# Patient Record
Sex: Male | Born: 1980 | Hispanic: No | Marital: Married | Smoking: Never smoker
Health system: Southern US, Community
[De-identification: ages and names within clinical notes are randomized; demographics above are authoritative.]

---

## 2019-04-11 DIAGNOSIS — E785 Hyperlipidemia, unspecified: Secondary | ICD-10-CM | POA: Insufficient documentation

## 2020-07-17 DIAGNOSIS — R7303 Prediabetes: Secondary | ICD-10-CM | POA: Insufficient documentation

## 2020-08-05 ENCOUNTER — Encounter (HOSPITAL_COMMUNITY)
Admission: EM | Disposition: A | Discharge status: Home / Self Care | Payer: Self-pay | Source: Home / Self Care | Attending: Emergency Medicine

## 2020-08-05 ENCOUNTER — Ambulatory Visit (HOSPITAL_COMMUNITY)
Admission: EM | Admit: 2020-08-05 | Discharge: 2020-08-05 | Disposition: A | Discharge status: Home / Self Care | Payer: 59 | Attending: General Surgery | Admitting: General Surgery

## 2020-08-05 ENCOUNTER — Other Ambulatory Visit (HOSPITAL_COMMUNITY): Payer: Self-pay

## 2020-08-05 ENCOUNTER — Emergency Department (HOSPITAL_COMMUNITY): Payer: 59

## 2020-08-05 ENCOUNTER — Emergency Department (HOSPITAL_COMMUNITY): Payer: 59 | Admitting: Certified Registered Nurse Anesthetist

## 2020-08-05 ENCOUNTER — Other Ambulatory Visit: Payer: Self-pay

## 2020-08-05 ENCOUNTER — Encounter (HOSPITAL_COMMUNITY): Payer: Self-pay | Admitting: Emergency Medicine

## 2020-08-05 DIAGNOSIS — K760 Fatty (change of) liver, not elsewhere classified: Secondary | ICD-10-CM | POA: Diagnosis not present

## 2020-08-05 DIAGNOSIS — K819 Cholecystitis, unspecified: Secondary | ICD-10-CM

## 2020-08-05 DIAGNOSIS — K8010 Calculus of gallbladder with chronic cholecystitis without obstruction: Secondary | ICD-10-CM | POA: Diagnosis not present

## 2020-08-05 DIAGNOSIS — R7303 Prediabetes: Secondary | ICD-10-CM | POA: Diagnosis not present

## 2020-08-05 DIAGNOSIS — E785 Hyperlipidemia, unspecified: Secondary | ICD-10-CM | POA: Diagnosis not present

## 2020-08-05 DIAGNOSIS — Z8616 Personal history of COVID-19: Secondary | ICD-10-CM | POA: Insufficient documentation

## 2020-08-05 HISTORY — PX: CHOLECYSTECTOMY: SHX55

## 2020-08-05 LAB — COMPREHENSIVE METABOLIC PANEL
ALT: 11 U/L (ref 0–44)
AST: 15 U/L (ref 15–41)
Albumin: 4.3 g/dL (ref 3.5–5.0)
Alkaline Phosphatase: 52 U/L (ref 38–126)
Anion gap: 9 (ref 5–15)
BUN: 19 mg/dL (ref 6–20)
CO2: 27 mmol/L (ref 22–32)
Calcium: 9.4 mg/dL (ref 8.9–10.3)
Chloride: 103 mmol/L (ref 98–111)
Creatinine, Ser: 0.95 mg/dL (ref 0.61–1.24)
GFR, Estimated: >60 mL/min (ref >60)
Glucose, Bld: 109 mg/dL — ABNORMAL HIGH (ref 70–99)
Potassium: 4.1 mmol/L (ref 3.5–5.1)
Sodium: 139 mmol/L (ref 135–145)
Total Bilirubin: 0.6 mg/dL (ref 0.3–1.2)
Total Protein: 7.4 g/dL (ref 6.5–8.1)

## 2020-08-05 LAB — RESP PANEL BY RT-PCR (FLU A&B, COVID) ARPGX2
Influenza A by PCR: NEGATIVE
Influenza B by PCR: NEGATIVE
SARS Coronavirus 2 by RT PCR: NEGATIVE

## 2020-08-05 LAB — CBC WITH DIFFERENTIAL/PLATELET
Abs Immature Granulocytes: 0.02 10*3/uL (ref 0.00–0.07)
Basophils Absolute: 0 10*3/uL (ref 0.0–0.1)
Basophils Relative: 0 %
Eosinophils Absolute: 0.3 10*3/uL (ref 0.0–0.5)
Eosinophils Relative: 3 %
HCT: 49.8 % (ref 39.0–52.0)
Hemoglobin: 16.4 g/dL (ref 13.0–17.0)
Immature Granulocytes: 0 %
Lymphocytes Relative: 35 %
Lymphs Abs: 3 10*3/uL (ref 0.7–4.0)
MCH: 30.4 pg (ref 26.0–34.0)
MCHC: 32.9 g/dL (ref 30.0–36.0)
MCV: 92.2 fL (ref 80.0–100.0)
Monocytes Absolute: 0.6 10*3/uL (ref 0.1–1.0)
Monocytes Relative: 7 %
Neutro Abs: 4.7 10*3/uL (ref 1.7–7.7)
Neutrophils Relative %: 55 %
Platelets: 346 10*3/uL (ref 150–400)
RBC: 5.4 MIL/uL (ref 4.22–5.81)
RDW: 12.3 % (ref 11.5–15.5)
WBC: 8.6 10*3/uL (ref 4.0–10.5)
nRBC: 0 % (ref 0.0–0.2)

## 2020-08-05 LAB — TROPONIN I (HIGH SENSITIVITY)
Troponin I (High Sensitivity): 5 ng/L (ref <18)
Troponin I (High Sensitivity): 5 ng/L (ref <18)

## 2020-08-05 LAB — LIPASE, BLOOD: Lipase: 64 U/L — ABNORMAL HIGH (ref 11–51)

## 2020-08-05 SURGERY — LAPAROSCOPIC CHOLECYSTECTOMY
Anesthesia: General | Site: Abdomen

## 2020-08-05 MED ORDER — 0.9 % SODIUM CHLORIDE (POUR BTL) OPTIME
TOPICAL | Status: DC | PRN
  Administered 2020-08-05: 1000 mL

## 2020-08-05 MED ORDER — FENTANYL CITRATE (PF) 100 MCG/2ML IJ SOLN
INTRAMUSCULAR | Status: AC
  Filled 2020-08-05: qty 2

## 2020-08-05 MED ORDER — KETOROLAC TROMETHAMINE 30 MG/ML IJ SOLN
30.0000 mg | Freq: Once | INTRAMUSCULAR | Status: AC
  Administered 2020-08-05: 30 mg via INTRAVENOUS

## 2020-08-05 MED ORDER — ONDANSETRON HCL 4 MG/2ML IJ SOLN
INTRAMUSCULAR | Status: AC
  Filled 2020-08-05: qty 2

## 2020-08-05 MED ORDER — PROPOFOL 10 MG/ML IV BOLUS
INTRAVENOUS | Status: AC
  Filled 2020-08-05: qty 20

## 2020-08-05 MED ORDER — CHLORHEXIDINE GLUCONATE 0.12 % MT SOLN: 15.0000 mL | Freq: Once | OROMUCOSAL | Status: AC

## 2020-08-05 MED ORDER — KETOROLAC TROMETHAMINE 30 MG/ML IJ SOLN
INTRAMUSCULAR | Status: AC
  Filled 2020-08-05: qty 1

## 2020-08-05 MED ORDER — SODIUM CHLORIDE 0.9 % IR SOLN
Status: DC | PRN
  Administered 2020-08-05: 1000 mL

## 2020-08-05 MED ORDER — OXYCODONE HCL 5 MG PO TABS
5.0000 mg | ORAL_TABLET | Freq: Four times a day (QID) | ORAL | 0 refills | Status: AC | PRN
  Filled 2020-08-05: qty 15, 4d supply, fill #0

## 2020-08-05 MED ORDER — ORAL CARE MOUTH RINSE: 15.0000 mL | Freq: Once | OROMUCOSAL | Status: AC

## 2020-08-05 MED ORDER — LACTATED RINGERS IV SOLN: INTRAVENOUS | Status: DC

## 2020-08-05 MED ORDER — IOHEXOL 300 MG/ML  SOLN
75.0000 mL | Freq: Once | INTRAMUSCULAR | Status: AC | PRN
  Administered 2020-08-05: 75 mL via INTRAVENOUS

## 2020-08-05 MED ORDER — SUGAMMADEX SODIUM 200 MG/2ML IV SOLN
INTRAVENOUS | Status: DC | PRN
  Administered 2020-08-05: 200 mg via INTRAVENOUS
  Administered 2020-08-05: 100 mg via INTRAVENOUS

## 2020-08-05 MED ORDER — LIDOCAINE VISCOUS HCL 2 % MT SOLN
15.0000 mL | Freq: Once | OROMUCOSAL | Status: AC
  Administered 2020-08-05: 15 mL via ORAL
  Filled 2020-08-05: qty 15

## 2020-08-05 MED ORDER — LIDOCAINE HCL (PF) 2 % IJ SOLN
INTRAMUSCULAR | Status: AC
  Filled 2020-08-05: qty 5

## 2020-08-05 MED ORDER — CHLORHEXIDINE GLUCONATE 0.12 % MT SOLN
OROMUCOSAL | Status: AC
  Administered 2020-08-05: 15 mL via OROMUCOSAL
  Filled 2020-08-05: qty 15

## 2020-08-05 MED ORDER — FENTANYL CITRATE (PF) 250 MCG/5ML IJ SOLN
INTRAMUSCULAR | Status: AC
  Filled 2020-08-05: qty 5

## 2020-08-05 MED ORDER — POLYETHYLENE GLYCOL 3350 17 G PO PACK: 17.0000 g | PACK | Freq: Every day | ORAL | 0 refills | Status: AC | PRN

## 2020-08-05 MED ORDER — OXYCODONE HCL 5 MG PO TABS: 5.0000 mg | ORAL_TABLET | Freq: Once | ORAL | Status: DC | PRN

## 2020-08-05 MED ORDER — ACETAMINOPHEN 500 MG PO TABS: 1000.0000 mg | ORAL_TABLET | Freq: Four times a day (QID) | ORAL | 2 refills | Status: AC | PRN

## 2020-08-05 MED ORDER — DEXAMETHASONE SODIUM PHOSPHATE 10 MG/ML IJ SOLN
INTRAMUSCULAR | Status: AC
  Filled 2020-08-05: qty 1

## 2020-08-05 MED ORDER — PHENYLEPHRINE 40 MCG/ML (10ML) SYRINGE FOR IV PUSH (FOR BLOOD PRESSURE SUPPORT)
PREFILLED_SYRINGE | INTRAVENOUS | Status: DC | PRN
  Administered 2020-08-05: 4 ug via INTRAVENOUS

## 2020-08-05 MED ORDER — OXYCODONE HCL 5 MG/5ML PO SOLN: 5.0000 mg | Freq: Once | ORAL | Status: DC | PRN

## 2020-08-05 MED ORDER — ACETAMINOPHEN 10 MG/ML IV SOLN: 1000.0000 mg | Freq: Once | INTRAVENOUS | Status: DC | PRN

## 2020-08-05 MED ORDER — DEXMEDETOMIDINE (PRECEDEX) IN NS 20 MCG/5ML (4 MCG/ML) IV SYRINGE
PREFILLED_SYRINGE | INTRAVENOUS | Status: AC
  Filled 2020-08-05: qty 5

## 2020-08-05 MED ORDER — MIDAZOLAM HCL 5 MG/5ML IJ SOLN
INTRAMUSCULAR | Status: DC | PRN
  Administered 2020-08-05: 2 mg via INTRAVENOUS

## 2020-08-05 MED ORDER — PROMETHAZINE HCL 25 MG/ML IJ SOLN: 6.2500 mg | INTRAMUSCULAR | Status: DC | PRN

## 2020-08-05 MED ORDER — ROCURONIUM BROMIDE 10 MG/ML (PF) SYRINGE
PREFILLED_SYRINGE | INTRAVENOUS | Status: DC | PRN
  Administered 2020-08-05: 50 mg via INTRAVENOUS

## 2020-08-05 MED ORDER — ONDANSETRON HCL 4 MG/2ML IJ SOLN
INTRAMUSCULAR | Status: DC | PRN
  Administered 2020-08-05: 4 mg via INTRAVENOUS

## 2020-08-05 MED ORDER — MIDAZOLAM HCL 2 MG/2ML IJ SOLN
INTRAMUSCULAR | Status: AC
  Filled 2020-08-05: qty 2

## 2020-08-05 MED ORDER — AMISULPRIDE (ANTIEMETIC) 5 MG/2ML IV SOLN: 10.0000 mg | Freq: Once | INTRAVENOUS | Status: DC | PRN

## 2020-08-05 MED ORDER — LIDOCAINE 2% (20 MG/ML) 5 ML SYRINGE
INTRAMUSCULAR | Status: DC | PRN
  Administered 2020-08-05: 60 mg via INTRAVENOUS

## 2020-08-05 MED ORDER — DEXMEDETOMIDINE (PRECEDEX) IN NS 20 MCG/5ML (4 MCG/ML) IV SYRINGE
PREFILLED_SYRINGE | INTRAVENOUS | Status: DC | PRN
  Administered 2020-08-05: 8 ug via INTRAVENOUS

## 2020-08-05 MED ORDER — ALUM & MAG HYDROXIDE-SIMETH 200-200-20 MG/5ML PO SUSP
30.0000 mL | Freq: Once | ORAL | Status: AC
  Administered 2020-08-05: 30 mL via ORAL
  Filled 2020-08-05: qty 30

## 2020-08-05 MED ORDER — FENTANYL CITRATE (PF) 100 MCG/2ML IJ SOLN
25.0000 ug | INTRAMUSCULAR | Status: DC | PRN
  Administered 2020-08-05: 25 ug via INTRAVENOUS

## 2020-08-05 MED ORDER — FENTANYL CITRATE (PF) 250 MCG/5ML IJ SOLN
INTRAMUSCULAR | Status: DC | PRN
  Administered 2020-08-05 (×2): 50 ug via INTRAVENOUS
  Administered 2020-08-05: 100 ug via INTRAVENOUS
  Administered 2020-08-05: 50 ug via INTRAVENOUS

## 2020-08-05 MED ORDER — BUPIVACAINE HCL 0.25 % IJ SOLN
INTRAMUSCULAR | Status: DC | PRN
  Administered 2020-08-05: 7 mL

## 2020-08-05 MED ORDER — PROPOFOL 10 MG/ML IV BOLUS
INTRAVENOUS | Status: DC | PRN
  Administered 2020-08-05: 150 mg via INTRAVENOUS
  Administered 2020-08-05: 30 mg via INTRAVENOUS

## 2020-08-05 MED ORDER — SODIUM CHLORIDE 0.9 % IV SOLN
2.0000 g | Freq: Once | INTRAVENOUS | Status: DC
  Filled 2020-08-05: qty 20

## 2020-08-05 SURGICAL SUPPLY — 37 items
CANISTER SUCT 3000ML PPV (MISCELLANEOUS) ×2 IMPLANT
CHLORAPREP W/TINT 26 (MISCELLANEOUS) ×2 IMPLANT
CLIP VESOLOCK MED LG 6/CT (CLIP) ×2 IMPLANT
COVER SURGICAL LIGHT HANDLE (MISCELLANEOUS) ×2 IMPLANT
COVER TRANSDUCER ULTRASND (DRAPES) ×2 IMPLANT
COVER WAND RF STERILE (DRAPES) ×2 IMPLANT
DERMABOND ADVANCED (GAUZE/BANDAGES/DRESSINGS) ×1
DERMABOND ADVANCED .7 DNX12 (GAUZE/BANDAGES/DRESSINGS) ×1 IMPLANT
ELECT REM PT RETURN 9FT ADLT (ELECTROSURGICAL) ×2
ELECTRODE REM PT RTRN 9FT ADLT (ELECTROSURGICAL) ×1 IMPLANT
GLOVE BIO SURGEON STRL SZ7.5 (GLOVE) ×2 IMPLANT
GOWN STRL REUS W/ TWL LRG LVL3 (GOWN DISPOSABLE) ×2 IMPLANT
GOWN STRL REUS W/ TWL XL LVL3 (GOWN DISPOSABLE) ×1 IMPLANT
GOWN STRL REUS W/TWL LRG LVL3 (GOWN DISPOSABLE) ×2
GOWN STRL REUS W/TWL XL LVL3 (GOWN DISPOSABLE) ×1
GRASPER SUT TROCAR 14GX15 (MISCELLANEOUS) ×2 IMPLANT
KIT BASIN OR (CUSTOM PROCEDURE TRAY) ×2 IMPLANT
KIT TURNOVER KIT B (KITS) ×2 IMPLANT
NEEDLE INSUFFLATION 14GA 120MM (NEEDLE) ×2 IMPLANT
NS IRRIG 1000ML POUR BTL (IV SOLUTION) ×2 IMPLANT
PAD ARMBOARD 7.5X6 YLW CONV (MISCELLANEOUS) ×2 IMPLANT
POUCH LAPAROSCOPIC INSTRUMENT (MISCELLANEOUS) ×2 IMPLANT
POUCH RETRIEVAL ECOSAC 10 (ENDOMECHANICALS) IMPLANT
POUCH RETRIEVAL ECOSAC 10MM (ENDOMECHANICALS)
SCISSORS LAP 5X35 DISP (ENDOMECHANICALS) ×2 IMPLANT
SET IRRIG TUBING LAPAROSCOPIC (IRRIGATION / IRRIGATOR) ×2 IMPLANT
SET TUBE SMOKE EVAC HIGH FLOW (TUBING) ×2 IMPLANT
SLEEVE ENDOPATH XCEL 5M (ENDOMECHANICALS) ×2 IMPLANT
SPECIMEN JAR SMALL (MISCELLANEOUS) ×2 IMPLANT
SUT MNCRL AB 4-0 PS2 18 (SUTURE) ×2 IMPLANT
TOWEL GREEN STERILE (TOWEL DISPOSABLE) ×2 IMPLANT
TOWEL GREEN STERILE FF (TOWEL DISPOSABLE) ×2 IMPLANT
TRAY LAPAROSCOPIC MC (CUSTOM PROCEDURE TRAY) ×2 IMPLANT
TROCAR XCEL NON-BLD 11X100MML (ENDOMECHANICALS) ×2 IMPLANT
TROCAR XCEL NON-BLD 5MMX100MML (ENDOMECHANICALS) ×2 IMPLANT
WARMER LAPAROSCOPE (MISCELLANEOUS) ×2 IMPLANT
WATER STERILE IRR 1000ML POUR (IV SOLUTION) ×2 IMPLANT

## 2020-08-05 NOTE — Anesthesia Preprocedure Evaluation (Addendum)
Anesthesia Evaluation  Patient identified by MRN, date of birth, ID band Patient awake    Reviewed: Allergy & Precautions, NPO status , Patient's Chart, lab work & pertinent test results  Airway Mallampati: II  TM Distance: >3 FB Neck ROM: Full    Dental no notable dental hx.    Pulmonary neg pulmonary ROS,    Pulmonary exam normal breath sounds clear to auscultation       Cardiovascular negative cardio ROS Normal cardiovascular exam Rhythm:Regular Rate:Normal  ECG: NSR, rate 74   Neuro/Psych negative neurological ROS  negative psych ROS   GI/Hepatic negative GI ROS, Neg liver ROS,   Endo/Other  negative endocrine ROS  Renal/GU negative Renal ROS     Musculoskeletal negative musculoskeletal ROS (+)   Abdominal   Peds  Hematology negative hematology ROS (+)   Anesthesia Other Findings ACUTE CHOLECYSTITIS  Reproductive/Obstetrics                            Anesthesia Physical Anesthesia Plan  ASA: I and emergent  Anesthesia Plan: General   Post-op Pain Management:    Induction: Intravenous  PONV Risk Score and Plan: 2 and Ondansetron, Dexamethasone, Midazolam and Treatment may vary due to age or medical condition  Airway Management Planned: Oral ETT  Additional Equipment:   Intra-op Plan:   Post-operative Plan: Extubation in OR  Informed Consent: I have reviewed the patients History and Physical, chart, labs and discussed the procedure including the risks, benefits and alternatives for the proposed anesthesia with the patient or authorized representative who has indicated his/her understanding and acceptance.     Dental advisory given and Interpreter used for interveiw  Plan Discussed with: CRNA  Anesthesia Plan Comments:        Anesthesia Quick Evaluation

## 2020-08-05 NOTE — ED Provider Notes (Signed)
[facility] EMERGENCY DEPARTMENT Provider Note   CSN: [id number] Arrival date & time: 08/05/20  0204     History Chief Complaint  Patient presents with  . Abdominal Pain    William Delacruz is a 40 y.o. male.  Patient presents to the emergency department for evaluation of abdominal pain.  Symptoms ongoing for months.  Patient reports upper abdominal pain that occurs after eating.  He reports if he eats too much at 1 time or multiple meals during the day the pain occurs.  He has forced himself to vomit and this has alleviated some of the symptoms.  He has had intermittent diarrhea with this.        History reviewed. No pertinent past medical history.  There are no problems to display for this patient.   History reviewed. No pertinent surgical history.     No family history on file.     Home Medications Prior to Admission medications   Not on File    Allergies    Patient has no known allergies.  Review of Systems   Review of Systems  Gastrointestinal: Positive for abdominal pain and diarrhea.  All other systems reviewed and are negative.   Physical Exam Updated Vital Signs BP 132/90   Pulse 75   Temp 98.2 F (36.8 C) (Oral)   Resp 17   Ht 5\' 7"  (1.702 m)   Wt 68 kg   SpO2 99%   BMI 23.49 kg/m   Physical Exam Vitals and nursing note reviewed.  Constitutional:      General: He is not in acute distress.    Appearance: Normal appearance. He is well-developed.  HENT:     Head: Normocephalic and atraumatic.     Right Ear: Hearing normal.     Left Ear: Hearing normal.     Nose: Nose normal.  Eyes:     Conjunctiva/sclera: Conjunctivae normal.     Pupils: Pupils are equal, round, and reactive to light.  Cardiovascular:     Rate and Rhythm: Regular rhythm.     Heart sounds: S1 normal and S2 normal. No murmur heard. No friction rub. No gallop.   Pulmonary:     Effort: Pulmonary effort is normal. No respiratory distress.     Breath sounds:  Normal breath sounds.  Chest:     Chest wall: No tenderness.  Abdominal:     General: Bowel sounds are normal.     Palpations: Abdomen is soft.     Tenderness: There is abdominal tenderness in the epigastric area. There is no guarding or rebound. Negative signs include Murphy's sign and McBurney's sign.     Hernia: No hernia is present.  Musculoskeletal:        General: Normal range of motion.     Cervical back: Normal range of motion and neck supple.  Skin:    General: Skin is warm and dry.     Findings: No rash.  Neurological:     Mental Status: He is alert and oriented to person, place, and time.     GCS: GCS eye subscore is 4. GCS verbal subscore is 5. GCS motor subscore is 6.     Cranial Nerves: No cranial nerve deficit.     Sensory: No sensory deficit.     Coordination: Coordination normal.  Psychiatric:        Speech: Speech normal.        Behavior: Behavior normal.        Thought Content: Thought  content normal.     ED Results / Procedures / Treatments   Labs (all labs ordered are listed, but only abnormal results are displayed) Labs Reviewed  COMPREHENSIVE METABOLIC PANEL - Abnormal; Notable for the following components:      Result Value   Glucose, Bld 109 (*)    All other components within normal limits  LIPASE, BLOOD - Abnormal; Notable for the following components:   Lipase 64 (*)    All other components within normal limits  RESP PANEL BY RT-PCR (FLU A&B, COVID) ARPGX2  CBC WITH DIFFERENTIAL/PLATELET  TROPONIN I (HIGH SENSITIVITY)  TROPONIN I (HIGH SENSITIVITY)    EKG None  Radiology CT ABDOMEN PELVIS W CONTRAST  Result Date: 08/05/2020 CLINICAL DATA:  Nonlocalized acute abdominal pain. EXAM: CT ABDOMEN AND PELVIS WITH CONTRAST TECHNIQUE: Multidetector CT imaging of the abdomen and pelvis was performed using the standard protocol following bolus administration of intravenous contrast. CONTRAST:  42mL OMNIPAQUE IOHEXOL 300 MG/ML  SOLN COMPARISON:   Ultrasound abdomen 08/05/2020 FINDINGS: Lower chest: No acute abnormality. Hepatobiliary: Diffusely decreased hepatic parenchyma. No focal liver abnormality. 0.5 cm calcified gallstone at the gallbladder neck with associated gallbladder wall thickening and trace pericholecystic fluid. No biliary dilatation. Pancreas: No focal lesion. Normal pancreatic contour. No surrounding inflammatory changes. No main pancreatic ductal dilatation. No pseudocyst formation. Spleen: Normal in size without focal abnormality. Adrenals/Urinary Tract: No adrenal nodule bilaterally. Bilateral kidneys enhance symmetrically. No hydronephrosis. No hydroureter. The urinary bladder is unremarkable. Stomach/Bowel: Stomach is within normal limits. No evidence of bowel wall thickening or dilatation. Appendix appears normal. Vascular/Lymphatic: No significant vascular findings are present. No enlarged abdominal or pelvic lymph nodes. Reproductive: Prostate is unremarkable. Other: No intraperitoneal free fluid. No intraperitoneal free gas. No organized fluid collection. Musculoskeletal: No acute or significant osseous findings. IMPRESSION: 1. Cholelithiasis with findings suggestive of acute cholecystitis. Possibly impacted 0.5cm gallstone within the gallbladder neck. 2. Mild hepatic steatosis. 3. No CT findings of acute pancreatitis. Electronically Signed   By: Tish Frederickson M.D.   On: 08/05/2020 06:37   US Abdomen Limited  Result Date: 08/05/2020 CLINICAL DATA:  Right upper quadrant abdominal pain, chest pain EXAM: ULTRASOUND ABDOMEN LIMITED RIGHT UPPER QUADRANT COMPARISON:  None. FINDINGS: Gallbladder: There is layering sludge within the gallbladder lumen. No shadowing gallstones identified. The gallbladder, however, is not distended. No gallbladder wall thickening or pericholecystic fluid is identified. The sonographic Eulah Pont sign is reportedly negative. Common bile duct: Diameter: 6 mm in proximal diameter Liver: Hepatic parenchymal  echogenicity is mildly diffusely increased in keeping with changes of mild hepatic steatosis. Focal fatty sparing noted within the gallbladder fossa. No focal intrahepatic masses or intrahepatic biliary ductal dilation is identified. Portal vein is patent on color Doppler imaging with normal direction of blood flow towards the liver. Other: No ascites IMPRESSION: Cholelithiasis without sonographic evidence of acute cholecystitis. Mild hepatic steatosis. Electronically Signed   By: Helyn Numbers MD   On: 08/05/2020 04:27   DG Abdomen Acute W/Chest  Result Date: 08/05/2020 CLINICAL DATA:  Chest pain. Abdominal pain. Right upper quadrant pain. Pt c/o of intermittent upper abdomen pain for 6 months. Pt experiences the pain after eating, the pain is much worse after a full meal. EXAM: DG ABDOMEN ACUTE WITH 1 VIEW CHEST COMPARISON:  None. FINDINGS: There is no evidence of dilated bowel loops or free intraperitoneal air. No radiopaque calculi or other significant radiographic abnormality is seen. Heart size and mediastinal contours are within normal limits. Both lungs are clear. IMPRESSION:  Negative abdominal radiographs.  No acute cardiopulmonary disease. Electronically Signed   By: Tish Frederickson M.D.   On: 08/05/2020 04:01    Procedures Procedures   Medications Ordered in ED Medications  cefTRIAXone (ROCEPHIN) 2 g in sodium chloride 0.9 % 100 mL IVPB (has no administration in time range)  alum & mag hydroxide-simeth (MAALOX/MYLANTA) 200-200-20 MG/5ML suspension 30 mL (30 mLs Oral Given 08/05/20 0311)    And  lidocaine (XYLOCAINE) 2 % viscous mouth solution 15 mL (15 mLs Oral Given 08/05/20 0312)  iohexol (OMNIPAQUE) 300 MG/ML solution 75 mL (75 mLs Intravenous Contrast Given 08/05/20 0612)    ED Course  I have reviewed the triage vital signs and the nursing notes.  Pertinent labs & imaging results that were available during my care of the patient were reviewed by me and considered in my medical decision  making (see chart for details).    MDM Rules/Calculators/A&P                          Patient with intermittent abdominal pain for several months.  Symptoms are brought on by eating meals.  Records reviewed from PCP at [facility].  Negative H. pylori breath test.  Mildly elevated lipase, lab work otherwise normal.  Was started on omeprazole.    Once again today his lipase is slightly elevated.  Remainder of labs are unremarkable.  Ultrasound with possible sludge in the gallbladder but no clear signs of cholecystitis.  Patient therefore underwent CT scan to further evaluate.  CT scan is more convincing for acute cholecystitis.  General surgery consult.   Final Clinical Impression(s) / ED Diagnoses Final diagnoses:  Cholecystitis    Rx / DC Orders ED Discharge Orders    None       Morningstar Toft, Canary Brim, MD 08/05/20 [phone]

## 2020-08-05 NOTE — Anesthesia Postprocedure Evaluation (Signed)
Anesthesia Post Note  Patient: William Delacruz  Procedure(s) Performed: LAPAROSCOPIC CHOLECYSTECTOMY (N/A Abdomen)     Patient location during evaluation: PACU Anesthesia Type: General Level of consciousness: awake Pain management: pain level controlled Vital Signs Assessment: post-procedure vital signs reviewed and stable Respiratory status: spontaneous breathing, nonlabored ventilation, respiratory function stable and patient connected to nasal cannula oxygen Cardiovascular status: blood pressure returned to baseline and stable Postop Assessment: no apparent nausea or vomiting Anesthetic complications: no   No complications documented.  Last Vitals:  Vitals:   08/05/20 1455 08/05/20 1510  BP: (!) 124/91 129/84  Pulse: 72 72  Resp: 14 14  Temp:  (!) 36.2 C  SpO2: 95% 95%    Last Pain:  Vitals:   08/05/20 1510  TempSrc:   PainSc: 4                  Chaka Jefferys P Finneas Mathe

## 2020-08-05 NOTE — ED Notes (Signed)
Provider at bedside

## 2020-08-05 NOTE — H&P (Signed)
History and Physical  William Delacruz Jun 09, 1980  [id number].    Requesting MD: Dr. Jaci Carrel Chief Complaint/Reason for Consult: early cholecystitis  HPI:  40 year old [place] speaking male with past medical history of hyperlipidemia and prediabetes who presented to [facility] ED due to to upper abdominal pain.  Pain has been going on for the past 6 months but has gotten worse and more frequent over the last month and he decided to come to the ED last night to find out what was going on.  Pain occurs after eating and he has related nausea and emesis.  Pain resolves with emesis.  He denies fever or chills at home.  ROS otherwise as below  Work-up in the ED significant for RUQ US showing cholelithiasis without sonographic evidence of acute cholecystitis and CT scan showing findings suggestive of acute cholecystitis with possibly impacted 0.5 cm gallstone within the gallbladder neck.  No findings of acute pancreatitis. WBC normal, LFTs normal.  Lipase 64  We were asked to see due to findings of possible early cholecystitis.  Allergies: No known drug allergies Blood thinners: None Past Surgeries: No past abdominal surgeries Last oral intake yesterday at 2000  ROS: Review of Systems  Constitutional: Negative for chills and fever.  Respiratory: Negative for cough, shortness of breath and wheezing.   Cardiovascular: Negative for chest pain, palpitations and leg swelling.  Gastrointestinal: Positive for abdominal pain, nausea and vomiting. Negative for constipation and diarrhea.    No family history on file.  History reviewed. No pertinent past medical history.  History reviewed. No pertinent surgical history.  Social History:  has no history on file for tobacco use, alcohol use, and drug use.  Allergies: No Known Allergies  (Not in a hospital admission)   Blood pressure (!) 143/100, pulse 69, temperature 98.2 F (36.8 C), temperature source Oral, resp. rate 18, height  5\' 7"  (1.702 m), weight 68 kg, SpO2 100 %. Physical Exam:  General: pleasant, WD, male who is laying in bed in NAD HEENT: head is normocephalic, atraumatic.  Pupils equal and round.  Conjunctiva normal.  Ears and nose without any masses or lesions.  Mouth is pink and moist Heart: regular, rate, and rhythm.  Normal s1,s2. No obvious murmurs, gallops, or rubs noted.  Palpable radial pulses bilaterally.  No cyanosis. Lungs: CTAB, no wheezes, rhonchi, or rales noted.  Respiratory effort nonlabored Abd: soft, ND, +BS, no masses, hernias, or organomegaly.  Tender in epigastrium.  Negative Murphy sign.  No rebound or guarding MS: all 4 extremities are symmetrical with no cyanosis, clubbing, or edema. Skin: warm and dry with no masses, lesions, or rashes Neuro: Cranial nerves 2-12 grossly intact, sensation is normal throughout Psych: A&Ox3 with an appropriate affect.   Results for orders placed or performed during the hospital encounter of 08/05/20 (from the past 48 hour(s))  CBC with Differential/Platelet     Status: None   Collection Time: 08/05/20  2:45 AM  Result Value Ref Range   WBC 8.6 4.0 - 10.5 K/uL   RBC 5.40 4.22 - 5.81 MIL/uL   Hemoglobin 16.4 13.0 - 17.0 g/dL   HCT 10/05/20 11.5 - 72.6 %   MCV 92.2 80.0 - 100.0 fL   MCH 30.4 26.0 - 34.0 pg   MCHC 32.9 30.0 - 36.0 g/dL   RDW 20.3 55.9 - 74.1 %   Platelets 346 150 - 400 K/uL   nRBC 0.0 0.0 - 0.2 %   Neutrophils Relative %  55 %   Neutro Abs 4.7 1.7 - 7.7 K/uL   Lymphocytes Relative 35 %   Lymphs Abs 3.0 0.7 - 4.0 K/uL   Monocytes Relative 7 %   Monocytes Absolute 0.6 0.1 - 1.0 K/uL   Eosinophils Relative 3 %   Eosinophils Absolute 0.3 0.0 - 0.5 K/uL   Basophils Relative 0 %   Basophils Absolute 0.0 0.0 - 0.1 K/uL   Immature Granulocytes 0 %   Abs Immature Granulocytes 0.02 0.00 - 0.07 K/uL    Comment: Performed at [facility], [address], [place], [place] [address]  Comprehensive metabolic panel     Status: Abnormal    Collection Time: 08/05/20  2:45 AM  Result Value Ref Range   Sodium 139 135 - 145 mmol/L   Potassium 4.1 3.5 - 5.1 mmol/L   Chloride 103 98 - 111 mmol/L   CO2 27 22 - 32 mmol/L   Glucose, Bld 109 (H) 70 - 99 mg/dL    Comment: Glucose reference range applies only to samples taken after fasting for at least 8 hours.   BUN 19 6 - 20 mg/dL   Creatinine, Ser 5.91 0.61 - 1.24 mg/dL   Calcium 9.4 8.9 - 63.8 mg/dL   Total Protein 7.4 6.5 - 8.1 g/dL   Albumin 4.3 3.5 - 5.0 g/dL   AST 15 15 - 41 U/L   ALT 11 0 - 44 U/L   Alkaline Phosphatase 52 38 - 126 U/L   Total Bilirubin 0.6 0.3 - 1.2 mg/dL   GFR, Estimated >46 >65 mL/min    Comment: (NOTE) Calculated using the CKD-EPI Creatinine Equation (2021)    Anion gap 9 5 - 15    Comment: Performed at [facility], [address] [place], [place] [address]  Lipase, blood     Status: Abnormal   Collection Time: 08/05/20  2:45 AM  Result Value Ref Range   Lipase 64 (H) 11 - 51 U/L    Comment: Performed at [facility], [address], [place], [place] [address]  Troponin I (High Sensitivity)     Status: None   Collection Time: 08/05/20  2:45 AM  Result Value Ref Range   Troponin I (High Sensitivity) 5 <18 ng/L    Comment: (NOTE) Elevated high sensitivity troponin I (hsTnI) values and significant  changes across serial measurements may suggest ACS but many other  chronic and acute conditions are known to elevate hsTnI results.  Refer to the "Links" section for chest pain algorithms and additional  guidance. Performed at [facility], [address] [place], [place] [address]   Troponin I (High Sensitivity)     Status: None   Collection Time: 08/05/20  4:46 AM  Result Value Ref Range   Troponin I (High Sensitivity) 5 <18 ng/L    Comment: (NOTE) Elevated high sensitivity troponin I (hsTnI) values and significant  changes across serial measurements may suggest ACS but many other  chronic and acute conditions are known  to elevate hsTnI results.  Refer to the "Links" section for chest pain algorithms and additional  guidance. Performed at [facility], [address] [place], [place] [address]    CT ABDOMEN PELVIS W CONTRAST  Result Date: 08/05/2020 CLINICAL DATA:  Nonlocalized acute abdominal pain. EXAM: CT ABDOMEN AND PELVIS WITH CONTRAST TECHNIQUE: Multidetector CT imaging of the abdomen and pelvis was performed using the standard protocol following bolus administration of intravenous contrast. CONTRAST:  71mL OMNIPAQUE IOHEXOL 300 MG/ML  SOLN COMPARISON:  Ultrasound abdomen 08/05/2020 FINDINGS: Lower chest: No acute abnormality. Hepatobiliary: Diffusely decreased hepatic parenchyma. No focal liver abnormality. 0.5 cm calcified gallstone at the gallbladder neck with associated gallbladder wall thickening and trace pericholecystic fluid. No biliary dilatation. Pancreas: No focal lesion. Normal pancreatic contour. No surrounding inflammatory changes. No main pancreatic ductal dilatation. No pseudocyst formation. Spleen: Normal in size without focal abnormality. Adrenals/Urinary Tract: No adrenal nodule bilaterally. Bilateral kidneys enhance symmetrically. No hydronephrosis. No hydroureter. The urinary bladder is unremarkable. Stomach/Bowel: Stomach is within normal limits. No evidence of bowel wall thickening or dilatation. Appendix appears normal. Vascular/Lymphatic: No significant vascular findings are present. No enlarged abdominal or pelvic lymph nodes. Reproductive: Prostate is unremarkable. Other: No intraperitoneal free fluid. No intraperitoneal free gas. No organized fluid collection. Musculoskeletal: No acute or significant osseous findings. IMPRESSION: 1. Cholelithiasis with findings suggestive of acute cholecystitis. Possibly impacted 0.5cm gallstone within the gallbladder neck. 2. Mild hepatic steatosis. 3. No CT findings of acute pancreatitis. Electronically Signed   By: Tish Frederickson M.D.   On:  08/05/2020 06:37   US Abdomen Limited  Result Date: 08/05/2020 CLINICAL DATA:  Right upper quadrant abdominal pain, chest pain EXAM: ULTRASOUND ABDOMEN LIMITED RIGHT UPPER QUADRANT COMPARISON:  None. FINDINGS: Gallbladder: There is layering sludge within the gallbladder lumen. No shadowing gallstones identified. The gallbladder, however, is not distended. No gallbladder wall thickening or pericholecystic fluid is identified. The sonographic Eulah Pont sign is reportedly negative. Common bile duct: Diameter: 6 mm in proximal diameter Liver: Hepatic parenchymal echogenicity is mildly diffusely increased in keeping with changes of mild hepatic steatosis. Focal fatty sparing noted within the gallbladder fossa. No focal intrahepatic masses or intrahepatic biliary ductal dilation is identified. Portal vein is patent on color Doppler imaging with normal direction of blood flow towards the liver. Other: No ascites IMPRESSION: Cholelithiasis without sonographic evidence of acute cholecystitis. Mild hepatic steatosis. Electronically Signed   By: Helyn Numbers MD   On: 08/05/2020 04:27   DG Abdomen Acute W/Chest  Result Date: 08/05/2020 CLINICAL DATA:  Chest pain. Abdominal pain. Right upper quadrant pain. Pt c/o of intermittent upper abdomen pain for 6 months. Pt experiences the pain after eating, the pain is much worse after a full meal. EXAM: DG ABDOMEN ACUTE WITH 1 VIEW CHEST COMPARISON:  None. FINDINGS: There is no evidence of dilated bowel loops or free intraperitoneal air. No radiopaque calculi or other significant radiographic abnormality is seen. Heart size and mediastinal contours are within normal limits. Both lungs are clear. IMPRESSION: Negative abdominal radiographs.  No acute cardiopulmonary disease. Electronically Signed   By: Tish Frederickson M.D.   On: 08/05/2020 04:01      Assessment/Plan Symptomatic cholelithiasis, possible early cholecystitis To the OR with Dr. Derrell Lolling for laparoscopic  cholecystectomy with possible intraoperative cholangiogram for treatment of symptomatic cholelithiasis and possible early cholecystitis. Receiving IV antibiotics in the ED.  Likely home following surgery.  Indications, risk, benefits including those related to general anesthesia were discussed with patient and he stated understanding.  All questions answered.  N.p.o. COVID test pending Receiving 2 g ceftriaxone in the ED  Due to language barrier a [place] interpretor was utilized for the duration of history and physical exam  Eric Form, [facility] 08/05/2020, 8:26 AM Please see Amion for pager number during day hours 7:00am-4:30pm

## 2020-08-05 NOTE — Anesthesia Procedure Notes (Signed)
Procedure Name: Intubation Date/Time: 08/05/2020 1:07 PM Performed by: Waynard Edwards, CRNA Pre-anesthesia Checklist: Patient identified, Emergency Drugs available, Suction available and Patient being monitored Patient Re-evaluated:Patient Re-evaluated prior to induction Oxygen Delivery Method: Circle system utilized Preoxygenation: Pre-oxygenation with 100% oxygen Induction Type: IV induction Ventilation: Mask ventilation without difficulty Laryngoscope Size: Miller and 2 Grade View: Grade II Tube type: Oral Tube size: 7.5 mm Number of attempts: 1 Airway Equipment and Method: Stylet Placement Confirmation: ETT inserted through vocal cords under direct vision,  positive ETCO2 and breath sounds checked- equal and bilateral Secured at: 22 cm Tube secured with: Tape Dental Injury: Teeth and Oropharynx as per pre-operative assessment

## 2020-08-05 NOTE — ED Notes (Signed)
Patient transported to CT 

## 2020-08-05 NOTE — Discharge Instructions (Addendum)
[facility] ° °Please arrive at least 30 min before your appointment to complete your check in paperwork.  If you are unable to arrive 30 min prior to your appointment time we may have to cancel or reschedule you. °LAPAROSCOPIC SURGERY: POST OP INSTRUCTIONS °Always review your discharge instruction sheet given to you by the facility where your surgery was performed. °IF YOU HAVE DISABILITY OR FAMILY LEAVE FORMS, YOU MUST BRING THEM TO THE OFFICE FOR PROCESSING.   °DO NOT GIVE THEM TO YOUR DOCTOR. ° °PAIN CONTROL ° °1. First take acetaminophen (Tylenol) AND/or ibuprofen (Advil) to control your pain after surgery.  Follow directions on package.  Taking acetaminophen (Tylenol) and/or ibuprofen (Advil) regularly after surgery will help to control your pain and lower the amount of prescription pain medication you may need.  You should not take more than 4,000 mg (4 grams) of acetaminophen (Tylenol) in 24 hours.  You should not take ibuprofen (Advil), aleve, motrin, naprosyn or other NSAIDS if you have a history of stomach ulcers or chronic kidney disease.  °2. A prescription for pain medication may be given to you upon discharge.  Take your pain medication as prescribed, if you still have uncontrolled pain after taking acetaminophen (Tylenol) or ibuprofen (Advil). °3. Use ice packs to help control pain. °4. If you need a refill on your pain medication, please contact your pharmacy.  They will contact our office to request authorization. Prescriptions will not be filled after 5pm or on week-ends. ° °HOME MEDICATIONS °5. Take your usually prescribed medications unless otherwise directed. ° °DIET °6. You should follow a light diet the first few days after arrival home.  Be sure to include lots of fluids daily. Avoid fatty, fried foods.  ° °CONSTIPATION °7. It is common to experience some constipation after surgery and if you are taking pain medication.  Increasing fluid intake and taking a stool  softener (such as Colace) will usually help or prevent this problem from occurring.  A mild laxative (Milk of Magnesia or Miralax) should be taken according to package instructions if there are no bowel movements after 48 hours. ° °WOUND/INCISION CARE °8. Most patients will experience some swelling and bruising in the area of the incisions.  Ice packs will help.  Swelling and bruising can take several days to resolve.  °9. Unless discharge instructions indicate otherwise, follow guidelines below  °a. STERI-STRIPS - you may remove your outer bandages 48 hours after surgery, and you may shower at that time.  You have steri-strips (small skin tapes) in place directly over the incision.  These strips should be left on the skin for 7-10 days.   °b. DERMABOND/SKIN GLUE - you may shower in 24 hours.  The glue will flake off over the next 2-3 weeks. °10. Any sutures or staples will be removed at the office during your follow-up visit. ° °ACTIVITIES °11. You may resume regular (light) daily activities beginning the next day--such as daily self-care, walking, climbing stairs--gradually increasing activities as tolerated.  You may have sexual intercourse when it is comfortable.  Refrain from any heavy lifting or straining until approved by your doctor. °a. You may drive when you are no longer taking prescription pain medication, you can comfortably wear a seatbelt, and you can safely maneuver your car and apply brakes. ° °FOLLOW-UP °12. You should see your doctor in the office for a follow-up appointment approximately 2-3 weeks after your surgery.  You should have been given your post-op/follow-up appointment when   your surgery was scheduled.  If you did not receive a post-op/follow-up appointment, make sure that you call for this appointment within a day or two after you arrive home to insure a convenient appointment time. ° °OTHER INSTRUCTIONS ° °WHEN TO CALL YOUR DOCTOR: °1. Fever over 101.0 °2. Inability to  urinate °3. Continued bleeding from incision. °4. Increased pain, redness, or drainage from the incision. °5. Increasing abdominal pain ° °The clinic staff is available to answer your questions during regular business hours.  Please don’t hesitate to call and ask to speak to one of the nurses for clinical concerns.  If you have a medical emergency, go to the nearest emergency room or call 911.  A surgeon from [facility] is always on call at the hospital. °[address], [address], [place], [place]  [address] ? [address] [address], [place], [place]   [address] °[phone] ? [phone] ? FAX [phone] ° ° ° °

## 2020-08-05 NOTE — Op Note (Signed)
08/05/2020  1:44 PM  PATIENT:  William Delacruz  40 y.o. male  PRE-OPERATIVE DIAGNOSIS:  ACUTE CHOLECYSTITIS  POST-OPERATIVE DIAGNOSIS:  ACUTE CHOLECYSTITIS  PROCEDURE:  Procedure(s): LAPAROSCOPIC CHOLECYSTECTOMY (N/A)  SURGEON:  Surgeon(s) and Role:    Axel Filler, MD - Primary  ASSISTANTS: none   ANESTHESIA:   local and general  EBL:  minimal   BLOOD ADMINISTERED:none  DRAINS: none   LOCAL MEDICATIONS USED:  BUPIVICAINE   SPECIMEN:  Source of Specimen:  gallbladder  DISPOSITION OF SPECIMEN:  PATHOLOGY  COUNTS:  YES  TOURNIQUET:  * No tourniquets in log *  DICTATION: .Dragon Dictation Findings: chronic inflammation of the gallbladder  Details: The patient was taken to the operating and placed in the supine position with bilateral SCDs in place.  The patient was prepped and draped in the usual sterile fashion. A time out was called and all facts were verified. A pneumoperitoneum was obtained via A Veress needle technique to a pressure of 12mm of mercury.  A 21mm trochar was then placed in the right upper quadrant under visualization, and there were no injuries to any abdominal organs. A 11 mm port was then placed in the umbilical region after infiltrating with local anesthesia under direct visualization. A second and third epigastric port and right lower quadrant port placement under direct visualization, respectively.    The gallbladder was identified and retracted, the peritoneum was then sharply dissected from the gallbladder and this dissection was carried down to Calot's triangle. The cystic duct was identified and stripped away circumferentially and seen going into the gallbladder 360, the critical angle was obtained.  2 hemoclips were placed proximally one distally and the cystic duct transected. The cystic artery was identified and 2 hemoclips placed proximally and one distally and transected.  We then proceeded to remove the gallbladder off the hepatic fossa with Bovie  cautery. A retrieval bag was then placed in the abdomen and gallbladder placed in the bag. The hepatic fossa was then reexamined and hemostasis was achieved with Bovie cautery and was excellent at the end of the case.   The subhepatic fossa and perihepatic fossa was then irrigated until the effluent was clear.  The gallbladder and bag were removed from the abdominal cavity. The 11 mm trocar fascia was reapproximated with the Endo Close #1 Vicryl x2.  The pneumoperitoneum was evacuated and all trochars removed under direct visulalization.  The skin was then closed with 4-0 Monocryl and the skin dressed with Dermabond.    The patient was awaken from general anesthesia and taken to the recovery room in stable condition.   PLAN OF CARE: Discharge to home after PACU  PATIENT DISPOSITION:  PACU - hemodynamically stable.   Delay start of Pharmacological VTE agent (>24hrs) due to surgical blood loss or risk of bleeding: not applicable

## 2020-08-05 NOTE — ED Notes (Signed)
Attempted new IV placement, unsuccessful, IV team ordered.

## 2020-08-05 NOTE — Transfer of Care (Signed)
Immediate Anesthesia Transfer of Care Note  Patient: William Delacruz  Procedure(s) Performed: LAPAROSCOPIC CHOLECYSTECTOMY (N/A Abdomen)  Patient Location: PACU  Anesthesia Type:General  Level of Consciousness: drowsy  Airway & Oxygen Therapy: Patient Spontanous Breathing and Patient connected to face mask oxygen  Post-op Assessment: Report given to RN and Post -op Vital signs reviewed and stable  Post vital signs: Reviewed and stable  Last Vitals:  Vitals Value Taken Time  BP    Temp    Pulse 80 1417  Resp 16 1417  SpO2 95 1417    Last Pain:  Vitals:   08/05/20 1139  TempSrc: Oral  PainSc:          Complications: No complications documented.

## 2020-08-05 NOTE — ED Triage Notes (Signed)
Pt reports upper abdominal pain that starts after meals X6 months;  He has been seen however they have not been able to find the cause of the pain. Pn more prevalent after he eats full meals.

## 2020-08-05 NOTE — ED Provider Notes (Signed)
Emergency Medicine Provider Triage Evaluation Note  William Delacruz , a 40 y.o. male  was evaluated in triage.  Pt complains of epigastric abd pain worse after eating. Pain has been present for the last 6 mos.  Pt reports he does not have the pain every time he eats, but it occurs almost every day for the last week. Previously it only happened a few times per month.  Pt pain today was unbearable. Vomiting makes the pain better.  No vomiting tonight. Pain radiates into the chest.  Review of Systems  Positive: Epigastric abd pain, nausea Negative: SOB  Physical Exam  BP (!) 156/103 (BP Location: Left Arm)   Pulse 78   Temp 98.2 F (36.8 C) (Oral)   Resp 18   Ht 5\' 7"  (1.702 m)   Wt 68 kg   SpO2 100%   BMI 23.49 kg/m    Gen:   Awake, uncomfortable appearing Resp:  Normal effort  MSK:   Moves extremities without difficulty  Other:  TTP along the epigastrium  Medical Decision Making  Medically screening exam initiated at 2:46 AM.  Appropriate orders placed.  Westley Rosasco was informed that the remainder of the evaluation will be completed by another provider, this initial triage assessment does not replace that evaluation, and the importance of remaining in the ED until their evaluation is complete.  abd and chest pain.  Work-up initiated.   Huldah Marin, 08/05/20 0246    10/05/20, DO 08/05/20 [phone]

## 2020-08-06 ENCOUNTER — Encounter (HOSPITAL_COMMUNITY): Payer: Self-pay | Admitting: General Surgery

## 2020-08-06 LAB — SURGICAL PATHOLOGY

## 2021-10-17 IMAGING — US US ABDOMEN LIMITED
1 series · 14 of 25 positions shown · non-contrast
Comparison: None.

CLINICAL DATA: Right upper quadrant abdominal pain, chest pain

EXAM:
ULTRASOUND ABDOMEN LIMITED RIGHT UPPER QUADRANT

[Series 1: us abdomen limited · 14 of 72 slices shown]
[im 1/72]
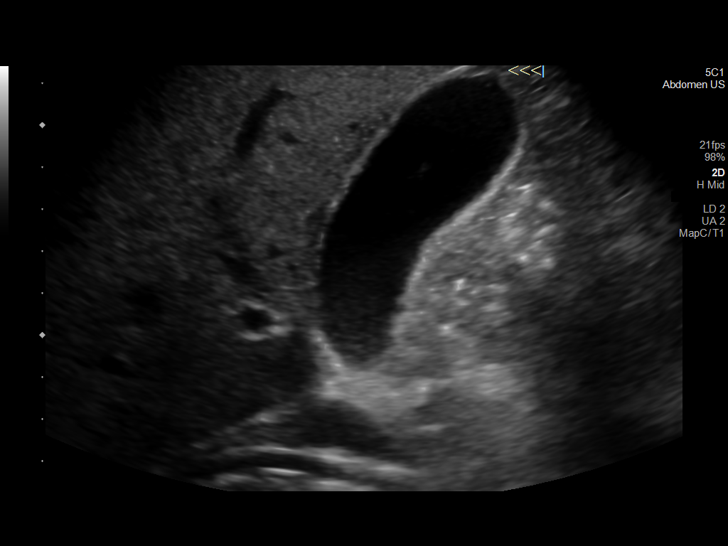
[im 6/72]
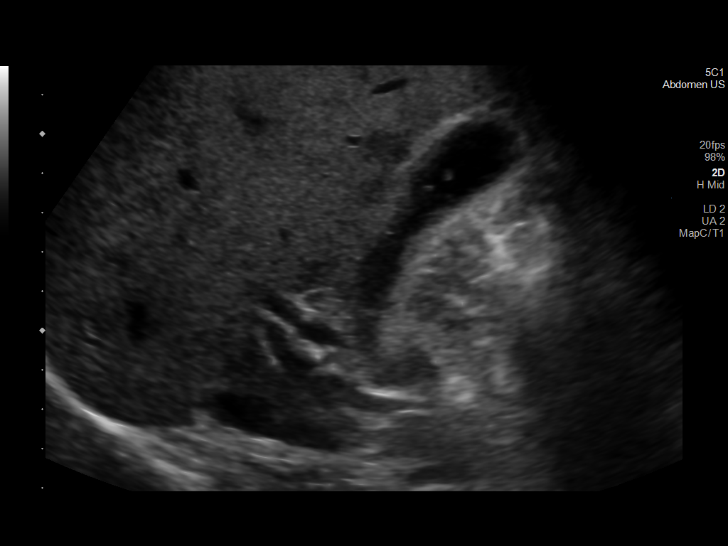
[im 12/72]
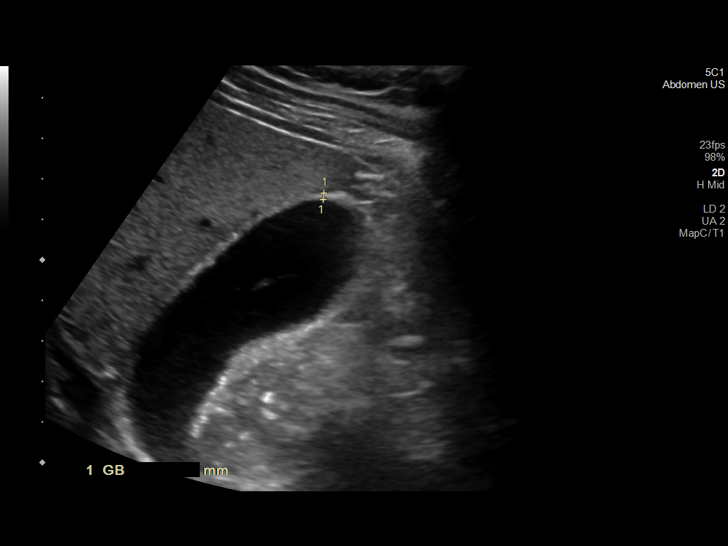
[im 18/72]
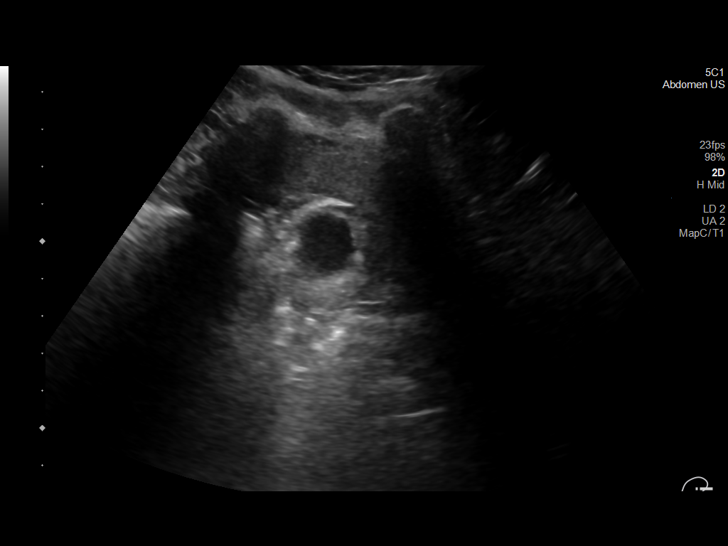
[im 24/72]
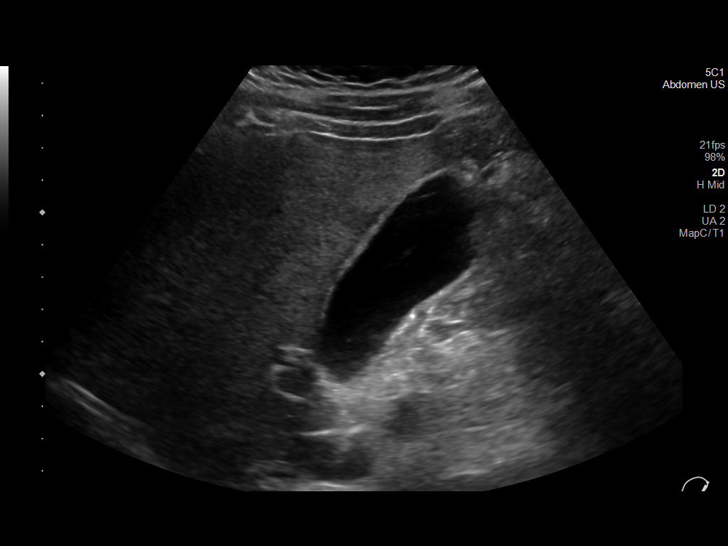
[im 27/72]
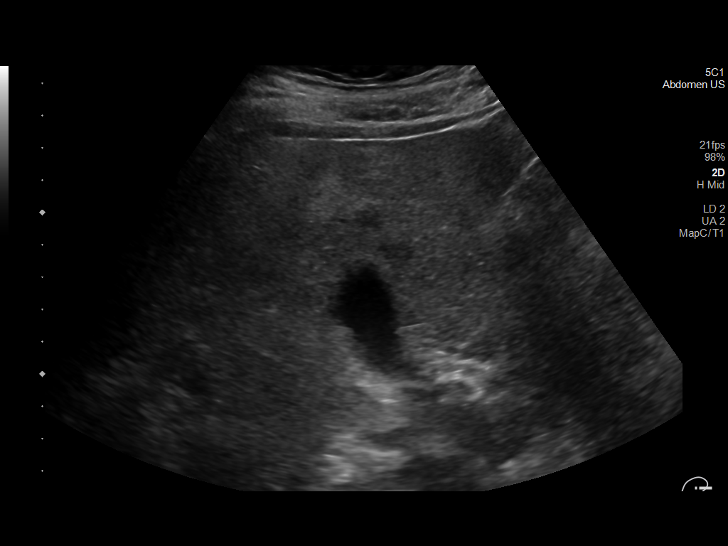
[im 33/72]
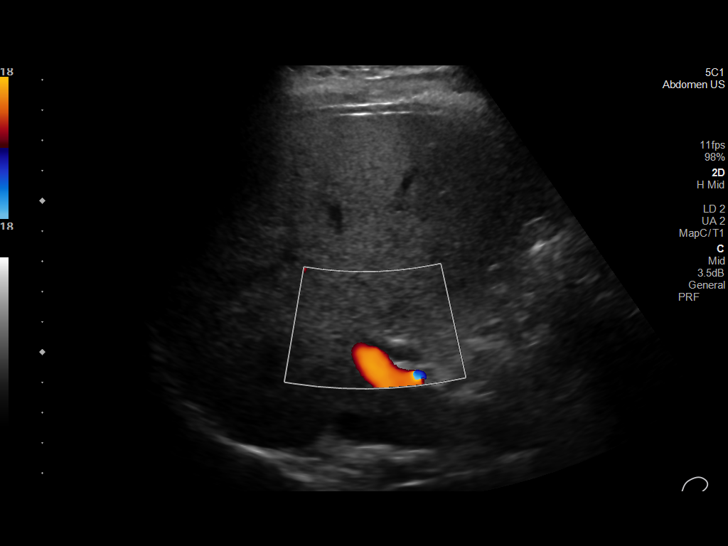
[im 39/72]
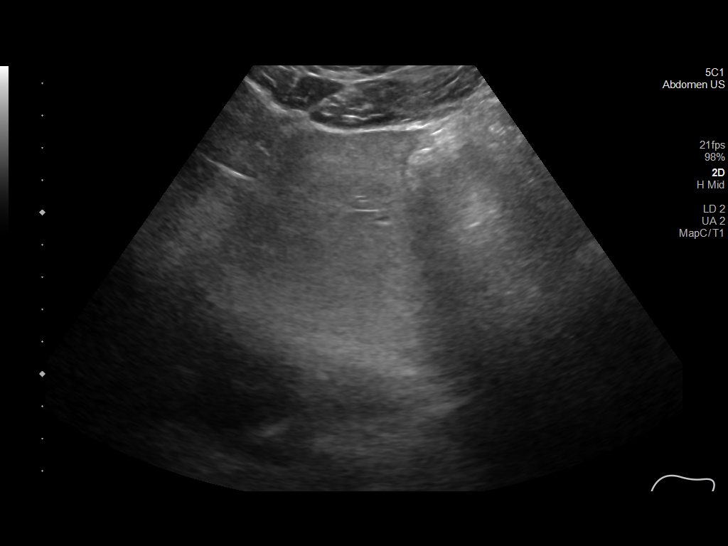
[im 45/72]
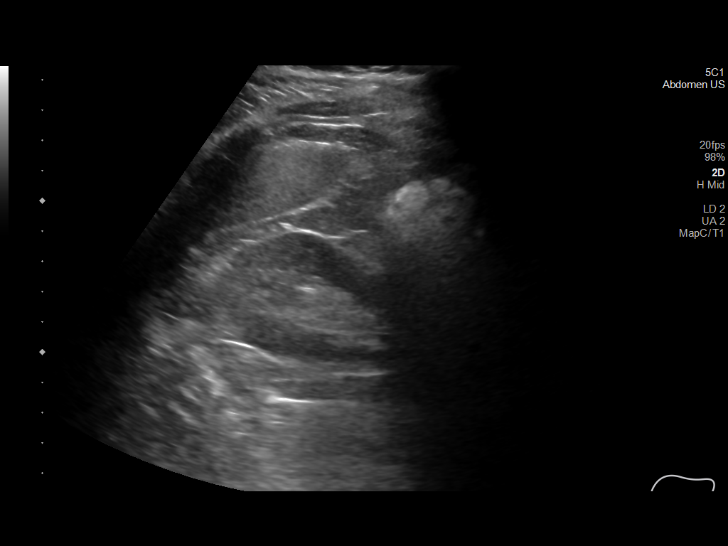
[im 48/72]
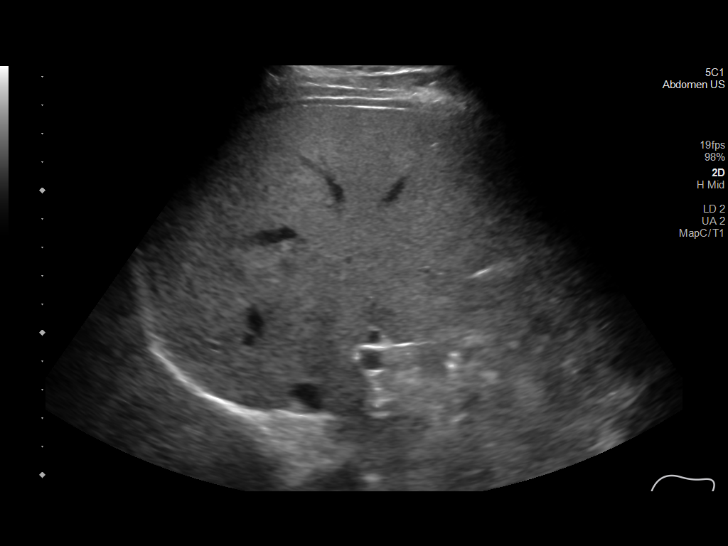
[im 54/72]
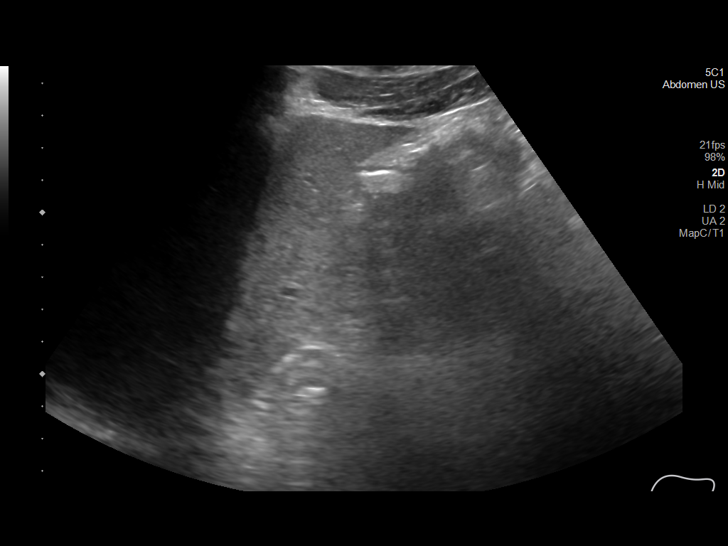
[im 60/72]
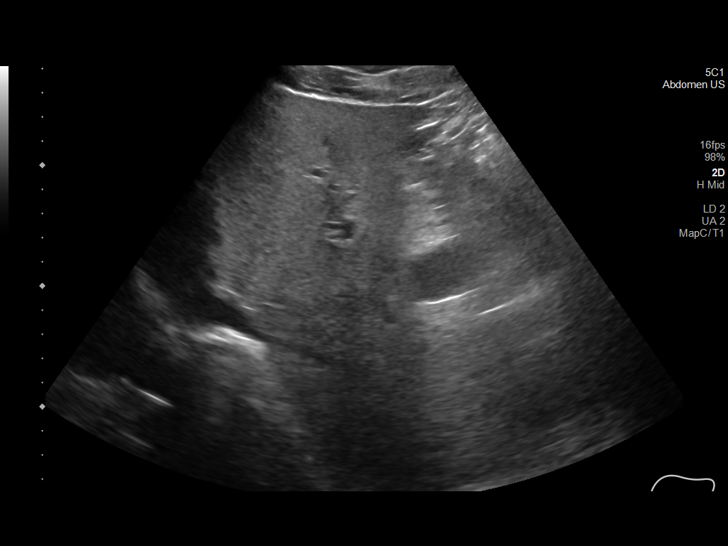
[im 66/72]
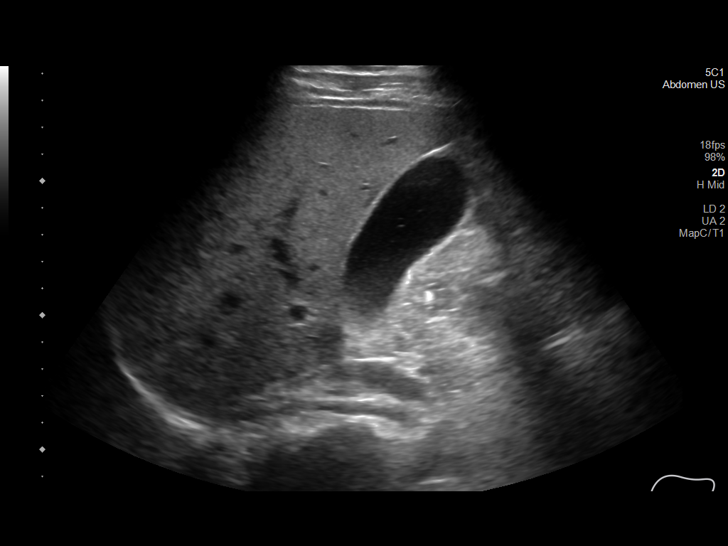
[im 72/72]
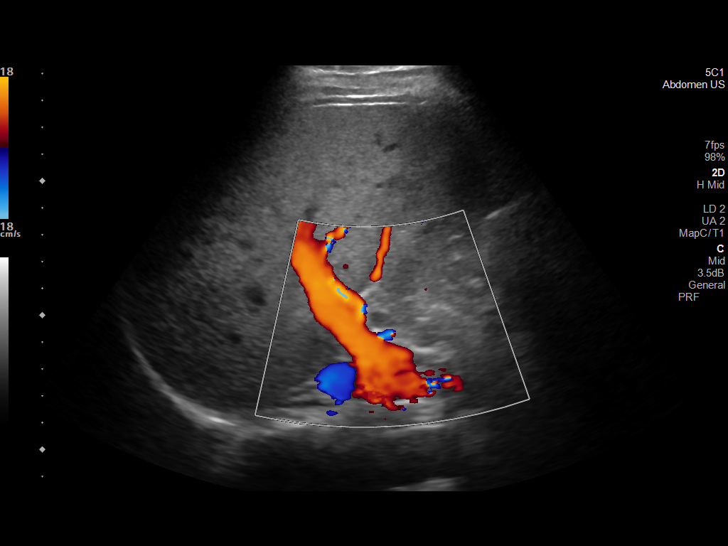

[14 of 25 positions shown; findings below may reference images not displayed]

FINDINGS: Gallbladder:

There is layering sludge within the gallbladder lumen. No shadowing
gallstones identified. The gallbladder, however, is not distended.
No gallbladder wall thickening or pericholecystic fluid is
identified. The sonographic Murphy sign is reportedly negative.

Common bile duct:

Diameter: 6 mm in proximal diameter

Liver:

Hepatic parenchymal echogenicity is mildly diffusely increased in
keeping with changes of mild hepatic steatosis. Focal fatty sparing
noted within the gallbladder fossa. No focal intrahepatic masses or
intrahepatic biliary ductal dilation is identified. Portal vein is
patent on color Doppler imaging with normal direction of blood flow
towards the liver.

Other: No ascites
IMPRESSION: Cholelithiasis without sonographic evidence of acute cholecystitis.

Mild hepatic steatosis.
